# Patient Record
Sex: Female | Born: 1975 | Race: White | Hispanic: No | Marital: Married | Smoking: Never smoker
Health system: Southern US, Community
[De-identification: ages and names within clinical notes are randomized; demographics above are authoritative.]

---

## 1997-09-03 ENCOUNTER — Other Ambulatory Visit: Admission: RE | Admit: 1997-09-03 | Discharge: 1997-09-03 | Payer: Self-pay | Admitting: Obstetrics and Gynecology

## 2001-10-05 ENCOUNTER — Other Ambulatory Visit: Admission: RE | Admit: 2001-10-05 | Discharge: 2001-10-05 | Payer: Self-pay | Admitting: Obstetrics and Gynecology

## 2003-10-16 ENCOUNTER — Other Ambulatory Visit: Admission: RE | Admit: 2003-10-16 | Discharge: 2003-10-16 | Payer: Self-pay | Admitting: Obstetrics & Gynecology

## 2004-04-08 ENCOUNTER — Ambulatory Visit (HOSPITAL_COMMUNITY): Admission: RE | Admit: 2004-04-08 | Discharge: 2004-04-08 | Payer: Self-pay | Admitting: *Deleted

## 2004-04-08 ENCOUNTER — Ambulatory Visit (HOSPITAL_BASED_OUTPATIENT_CLINIC_OR_DEPARTMENT_OTHER): Admission: RE | Admit: 2004-04-08 | Discharge: 2004-04-08 | Payer: Self-pay | Admitting: *Deleted

## 2004-10-26 ENCOUNTER — Other Ambulatory Visit: Admission: RE | Admit: 2004-10-26 | Discharge: 2004-10-26 | Payer: Self-pay | Admitting: Obstetrics and Gynecology

## 2004-10-27 ENCOUNTER — Other Ambulatory Visit: Admission: RE | Admit: 2004-10-27 | Discharge: 2004-10-27 | Payer: Self-pay | Admitting: Obstetrics and Gynecology

## 2010-08-31 ENCOUNTER — Other Ambulatory Visit: Payer: Self-pay | Admitting: Obstetrics and Gynecology

## 2010-10-06 ENCOUNTER — Other Ambulatory Visit: Payer: Self-pay | Admitting: Obstetrics and Gynecology

## 2013-03-05 ENCOUNTER — Ambulatory Visit (INDEPENDENT_AMBULATORY_CARE_PROVIDER_SITE_OTHER): Payer: BC Managed Care – PPO | Admitting: Podiatry

## 2013-03-05 ENCOUNTER — Encounter: Payer: Self-pay | Admitting: Podiatry

## 2013-03-05 ENCOUNTER — Ambulatory Visit (INDEPENDENT_AMBULATORY_CARE_PROVIDER_SITE_OTHER): Payer: BC Managed Care – PPO

## 2013-03-05 VITALS — BP 97/58 | HR 67 | Resp 16 | Ht 63.0 in | Wt 112.0 lb

## 2013-03-05 DIAGNOSIS — M8430XA Stress fracture, unspecified site, initial encounter for fracture: Secondary | ICD-10-CM

## 2013-03-05 DIAGNOSIS — S92919A Unspecified fracture of unspecified toe(s), initial encounter for closed fracture: Secondary | ICD-10-CM

## 2013-03-05 NOTE — Progress Notes (Signed)
   Subjective:    Patient ID: Anna Parks, female    DOB: 17-Sep-1975, 38 y.o.   MRN: [id number]  HPI Comments: Bruising of my foot for no reason , it started last Saturday wore heels and when i took them off i noticed that my foot started aching real bad and by Saturday night the foot started bruising and swelled and tingly right foot , top and bottom of foot is bruised      Review of Systems  All other systems reviewed and are negative.       Objective:   Physical Exam I have reviewed her past medical history medications allergies surgeries social history and review of systems. Vital signs are stable she is alert and oriented x3. Pulses are strongly palpable bilateral. Capillary fill time to digits one through 5 of the bilateral foot is noted to be immediate. Deep tendon reflexes are intact bilateral. Neurologic sensorium is intact per since once the monofilament. Muscle strength is 5 over 5 dorsiflexors plantar flexors inverters everters all intrinsic musculature is intact. Orthopedic evaluation demonstrates all joints distal to the ankle have a full range of motion without crepitation. She does have tenderness on range of motion and palpation of the base of the fourth proximal phalanx and the metatarsophalangeal joint right foot. Radiographic evaluation does demonstrate a very fine nondisplaced non-comminuted fracture of the medial condyle of the base of the proximal phalanx fourth digit right foot. Cutaneous evaluation does demonstrates supple well hydrated cutis with mild erythema and ecchymosis overlying the dorsal and dorsal lateral aspect of the forefoot and an area of ecchymosis just beneath the fourth metatarsal plantarly. Radiographs also demonstrate a very early and very mild osteopenia to the distal foot.        Assessment & Plan:  Assessment: Fracture base of the fourth proximal phalanx right foot.  Plan: Discussed etiology pathology conservative versus surgical therapies.  I suggested that she wear hard soled shoes for the next few weeks and cut back on high impact activities.

## 2017-05-17 DIAGNOSIS — J019 Acute sinusitis, unspecified: Secondary | ICD-10-CM | POA: Diagnosis not present

## 2017-07-01 DIAGNOSIS — N390 Urinary tract infection, site not specified: Secondary | ICD-10-CM | POA: Diagnosis not present

## 2017-09-19 DIAGNOSIS — Z01419 Encounter for gynecological examination (general) (routine) without abnormal findings: Secondary | ICD-10-CM | POA: Diagnosis not present

## 2017-09-19 DIAGNOSIS — Z681 Body mass index (BMI) 19 or less, adult: Secondary | ICD-10-CM | POA: Diagnosis not present

## 2017-09-19 DIAGNOSIS — Z30432 Encounter for removal of intrauterine contraceptive device: Secondary | ICD-10-CM | POA: Diagnosis not present

## 2017-09-20 DIAGNOSIS — Z1231 Encounter for screening mammogram for malignant neoplasm of breast: Secondary | ICD-10-CM | POA: Diagnosis not present

## 2017-09-21 ENCOUNTER — Other Ambulatory Visit: Payer: Self-pay | Admitting: Obstetrics and Gynecology

## 2017-09-21 DIAGNOSIS — R928 Other abnormal and inconclusive findings on diagnostic imaging of breast: Secondary | ICD-10-CM

## 2017-09-22 ENCOUNTER — Ambulatory Visit
Admission: RE | Admit: 2017-09-22 | Discharge: 2017-09-22 | Disposition: A | Discharge status: Home / Self Care | Payer: 59 | Source: Ambulatory Visit | Attending: Obstetrics and Gynecology | Admitting: Obstetrics and Gynecology

## 2017-09-22 DIAGNOSIS — R928 Other abnormal and inconclusive findings on diagnostic imaging of breast: Secondary | ICD-10-CM

## 2017-09-22 DIAGNOSIS — R922 Inconclusive mammogram: Secondary | ICD-10-CM | POA: Diagnosis not present

## 2017-09-22 DIAGNOSIS — N6311 Unspecified lump in the right breast, upper outer quadrant: Secondary | ICD-10-CM | POA: Diagnosis not present

## 2017-09-26 DIAGNOSIS — Z30430 Encounter for insertion of intrauterine contraceptive device: Secondary | ICD-10-CM | POA: Diagnosis not present

## 2017-11-01 DIAGNOSIS — J029 Acute pharyngitis, unspecified: Secondary | ICD-10-CM | POA: Diagnosis not present

## 2017-11-01 DIAGNOSIS — J069 Acute upper respiratory infection, unspecified: Secondary | ICD-10-CM | POA: Diagnosis not present

## 2017-11-04 DIAGNOSIS — J019 Acute sinusitis, unspecified: Secondary | ICD-10-CM | POA: Diagnosis not present

## 2017-11-04 DIAGNOSIS — R05 Cough: Secondary | ICD-10-CM | POA: Diagnosis not present

## 2017-11-07 DIAGNOSIS — Z30431 Encounter for routine checking of intrauterine contraceptive device: Secondary | ICD-10-CM | POA: Diagnosis not present

## 2017-11-22 DIAGNOSIS — K219 Gastro-esophageal reflux disease without esophagitis: Secondary | ICD-10-CM | POA: Diagnosis not present

## 2017-11-22 DIAGNOSIS — J45998 Other asthma: Secondary | ICD-10-CM | POA: Diagnosis not present

## 2017-12-15 DIAGNOSIS — J029 Acute pharyngitis, unspecified: Secondary | ICD-10-CM | POA: Diagnosis not present

## 2017-12-20 DIAGNOSIS — J45998 Other asthma: Secondary | ICD-10-CM | POA: Diagnosis not present

## 2017-12-20 DIAGNOSIS — K219 Gastro-esophageal reflux disease without esophagitis: Secondary | ICD-10-CM | POA: Diagnosis not present

## 2017-12-20 DIAGNOSIS — T7849XA Other allergy, initial encounter: Secondary | ICD-10-CM | POA: Diagnosis not present

## 2017-12-21 DIAGNOSIS — T7849XA Other allergy, initial encounter: Secondary | ICD-10-CM | POA: Diagnosis not present

## 2017-12-27 DIAGNOSIS — Z0000 Encounter for general adult medical examination without abnormal findings: Secondary | ICD-10-CM | POA: Diagnosis not present

## 2018-01-02 DIAGNOSIS — R739 Hyperglycemia, unspecified: Secondary | ICD-10-CM | POA: Diagnosis not present

## 2018-01-02 DIAGNOSIS — Z0000 Encounter for general adult medical examination without abnormal findings: Secondary | ICD-10-CM | POA: Diagnosis not present

## 2018-01-02 DIAGNOSIS — Z6821 Body mass index (BMI) 21.0-21.9, adult: Secondary | ICD-10-CM | POA: Diagnosis not present

## 2018-03-01 ENCOUNTER — Encounter: Payer: Self-pay | Admitting: Podiatry

## 2018-04-04 DIAGNOSIS — R739 Hyperglycemia, unspecified: Secondary | ICD-10-CM | POA: Diagnosis not present

## 2018-04-06 DIAGNOSIS — Z6821 Body mass index (BMI) 21.0-21.9, adult: Secondary | ICD-10-CM | POA: Diagnosis not present

## 2018-04-06 DIAGNOSIS — R739 Hyperglycemia, unspecified: Secondary | ICD-10-CM | POA: Diagnosis not present

## 2018-04-09 DIAGNOSIS — N3000 Acute cystitis without hematuria: Secondary | ICD-10-CM | POA: Diagnosis not present

## 2018-05-14 DIAGNOSIS — N390 Urinary tract infection, site not specified: Secondary | ICD-10-CM | POA: Diagnosis not present

## 2019-07-24 ENCOUNTER — Other Ambulatory Visit: Payer: Self-pay | Admitting: Radiology

## 2019-07-24 DIAGNOSIS — N6310 Unspecified lump in the right breast, unspecified quadrant: Secondary | ICD-10-CM

## 2019-08-13 ENCOUNTER — Ambulatory Visit
Admission: RE | Admit: 2019-08-13 | Discharge: 2019-08-13 | Disposition: A | Discharge status: Home / Self Care | Payer: 59 | Source: Ambulatory Visit | Attending: Radiology | Admitting: Radiology

## 2019-08-13 ENCOUNTER — Other Ambulatory Visit: Payer: Self-pay

## 2019-08-13 DIAGNOSIS — N6310 Unspecified lump in the right breast, unspecified quadrant: Secondary | ICD-10-CM

## 2019-09-06 IMAGING — MG DIGITAL DIAGNOSTIC UNILATERAL RIGHT MAMMOGRAM WITH TOMO AND CAD
4 series · 4 of 12 positions shown · non-contrast
Comparison: September 20, 2017

CLINICAL DATA: 42-year-old patient recalled recent screening
mammogram for evaluation.

EXAM:
DIGITAL DIAGNOSTIC RIGHT MAMMOGRAM WITH TOMO
ULTRASOUND RIGHT BREAST

[R CC synth-2D]
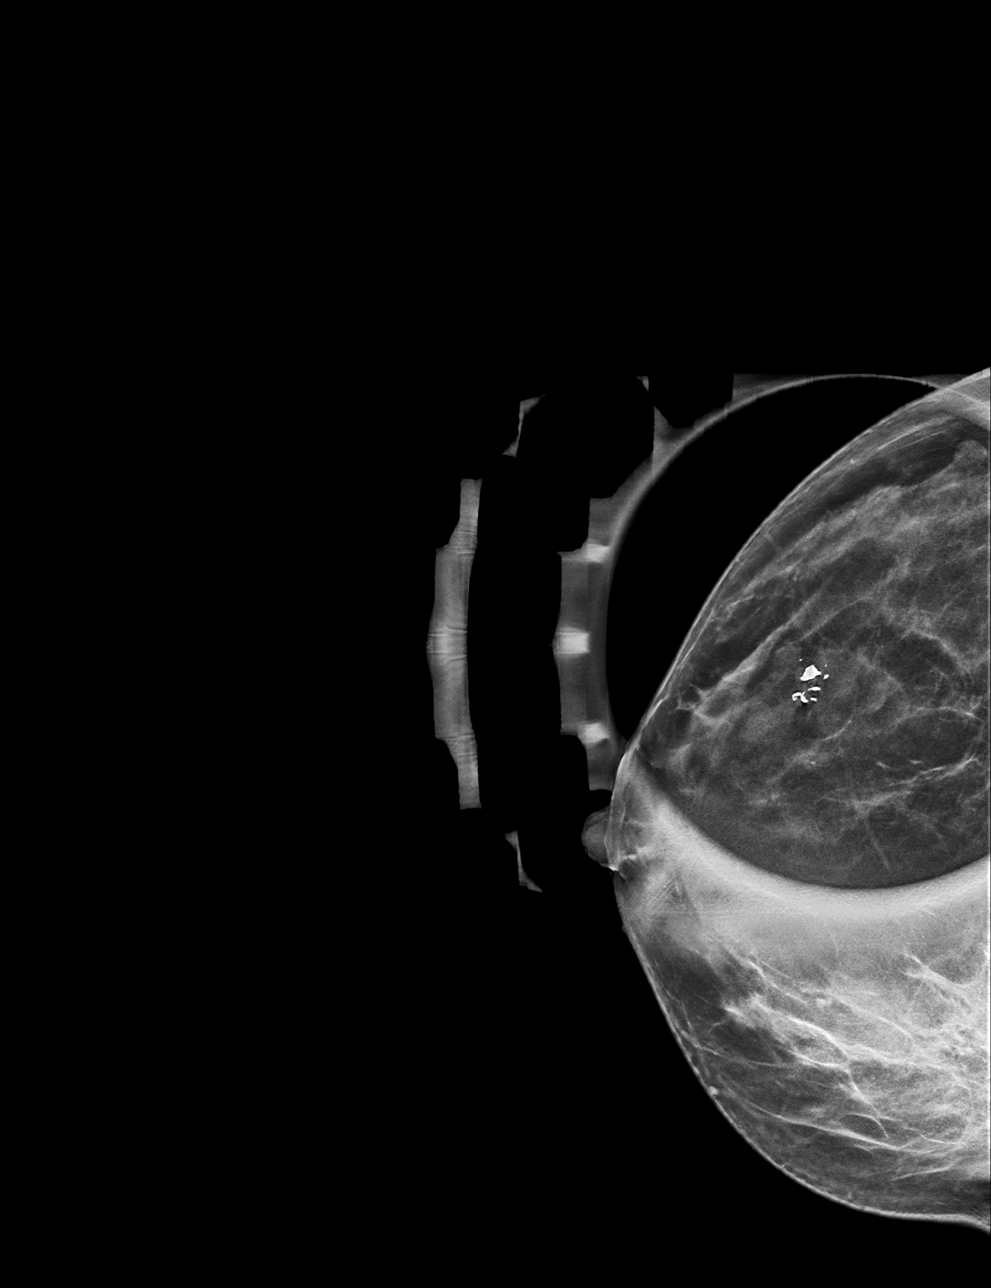

[R ML synth-2D]
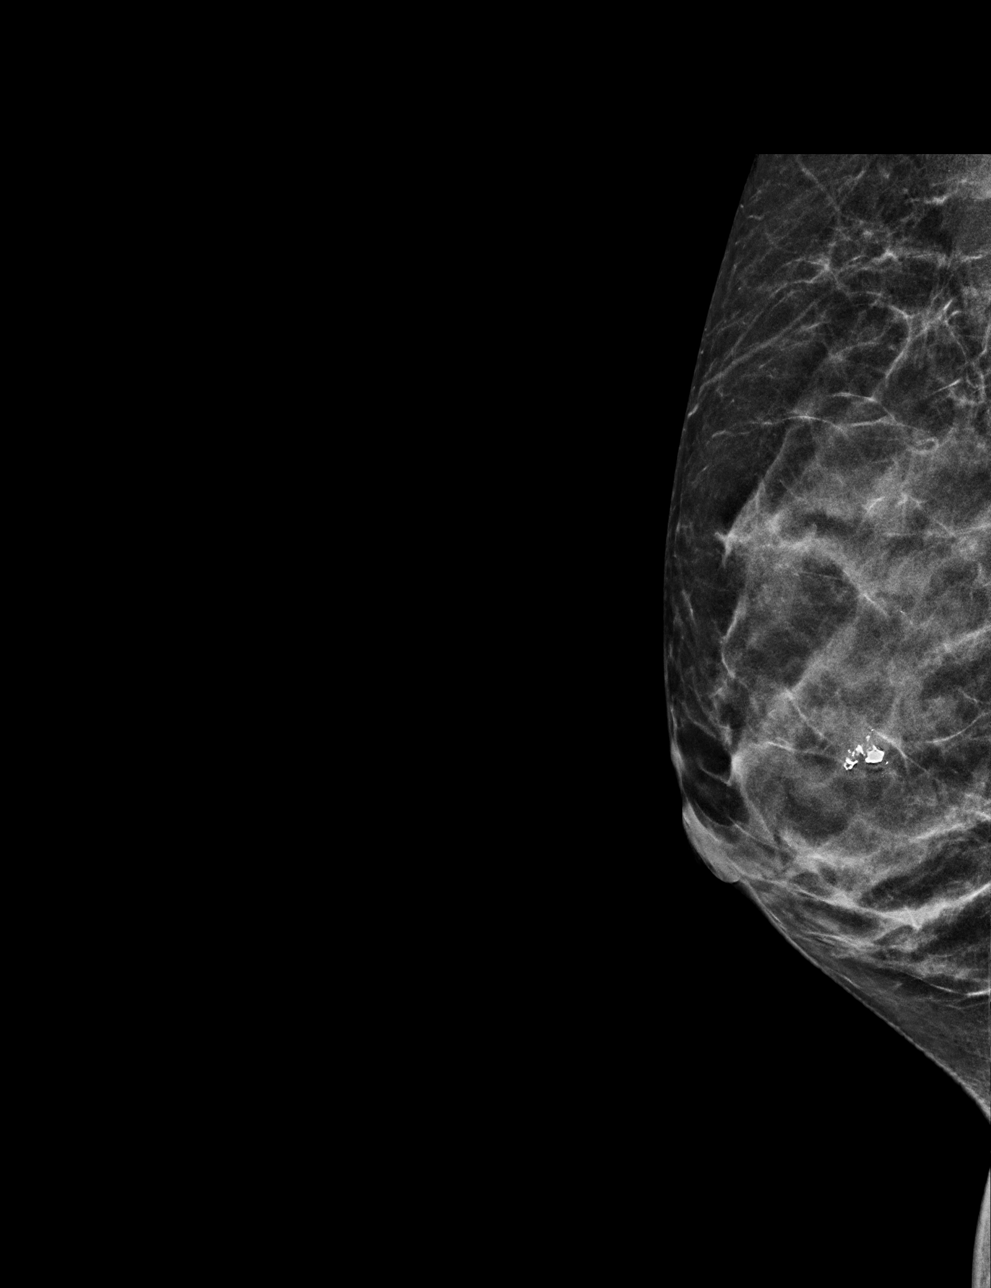

[R CC tomo · tomo slice 23/44.0]
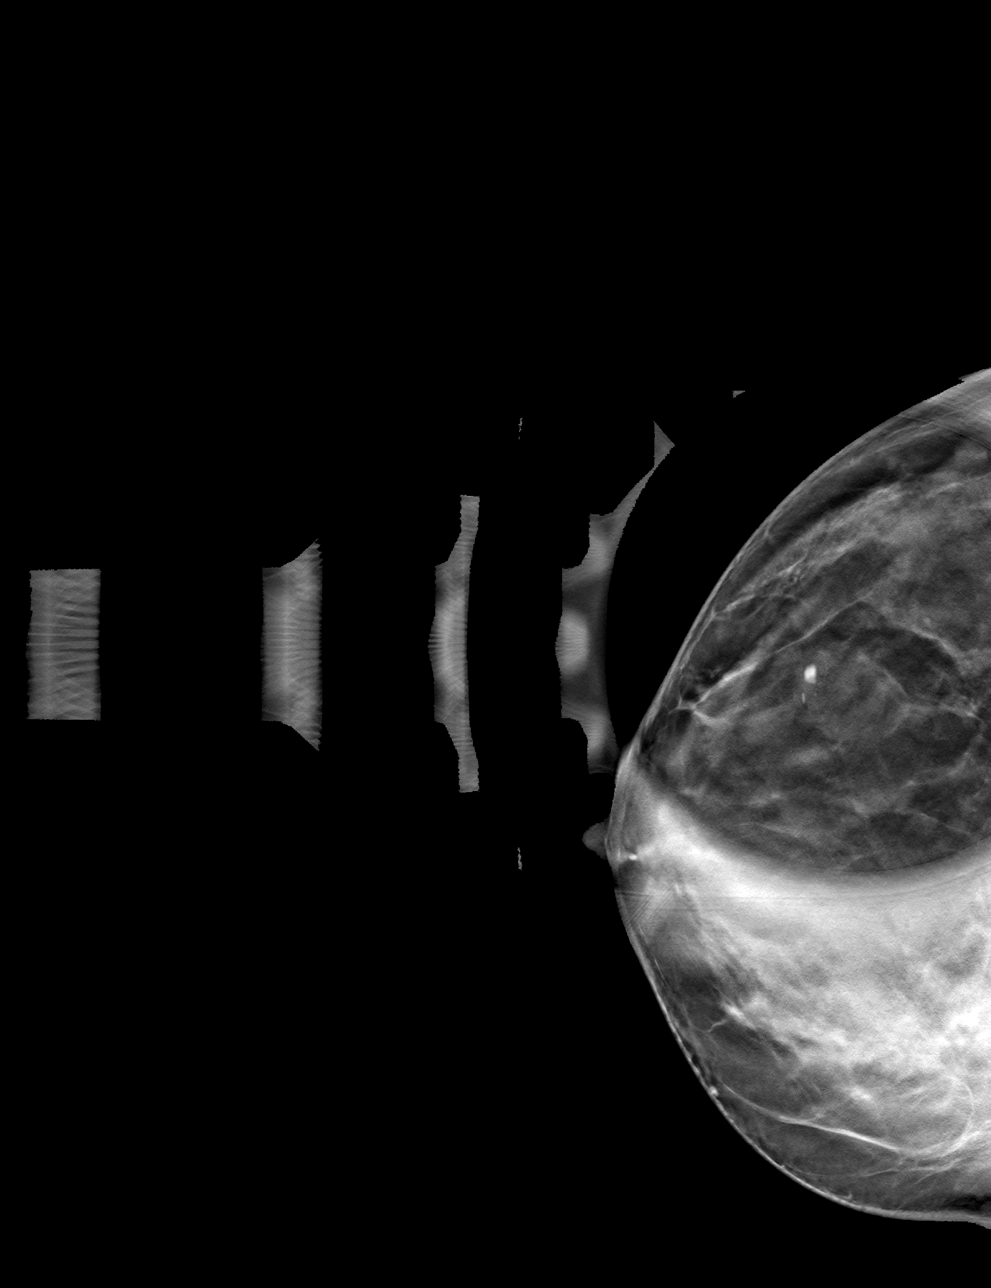

[R ML tomo · tomo slice 23/45.0]
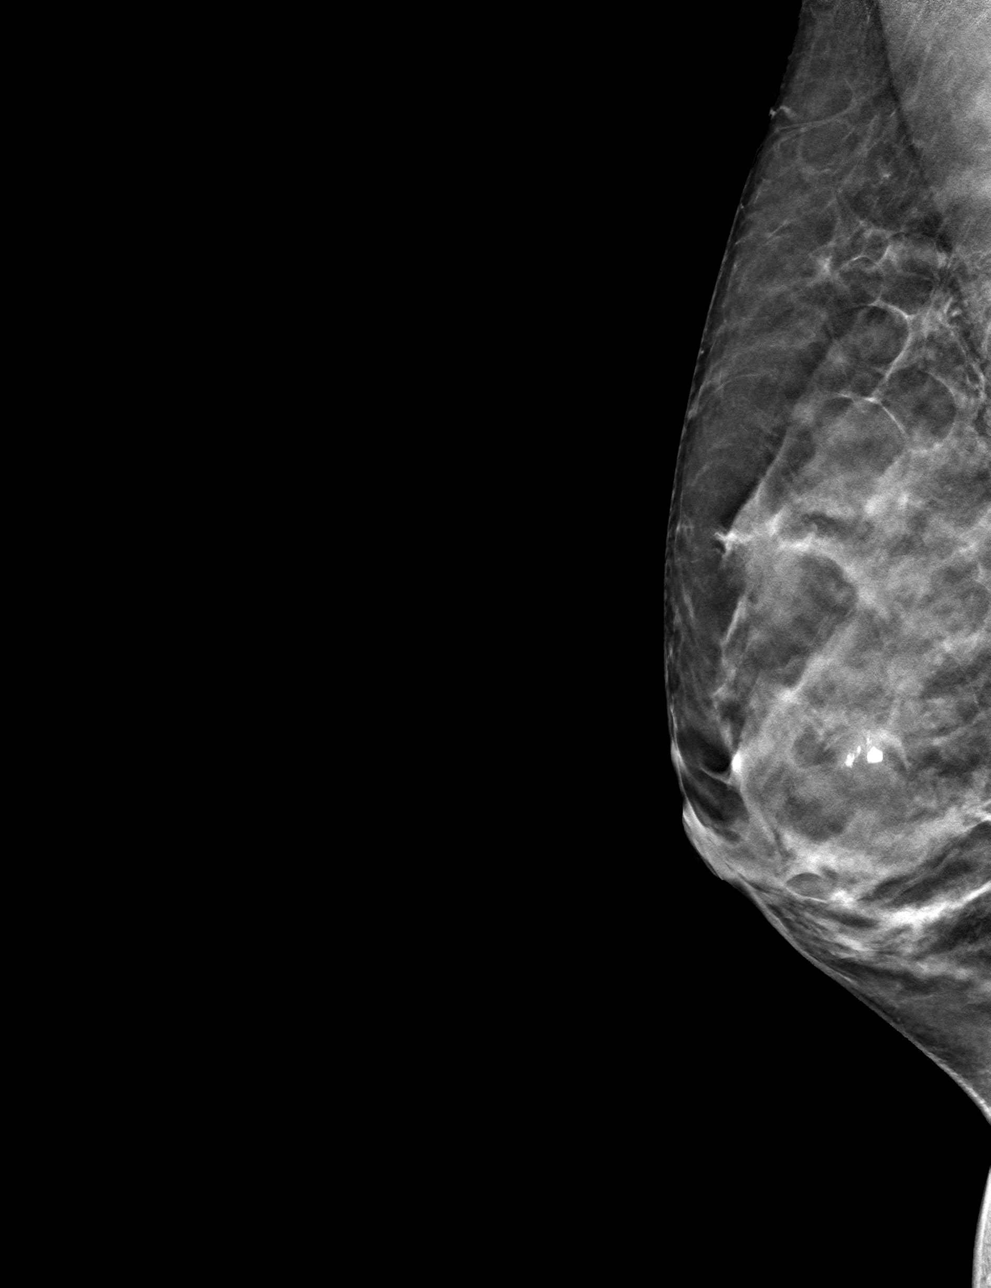

[4 of 12 positions shown; findings below may reference images not displayed]

ACR Breast Density Category c: The breast tissue is heterogeneously
dense, which may obscure small masses.
FINDINGS: Spot compression view of the outer right breast with tomography
confirms a circumscribed 0.5 cm mass. This mass is not visualized on
the 90 degree view with tomography performed today.

On physical exam, no mass is palpated in outer right breast.

Targeted ultrasound is performed, showing a circumscribed oval mass
at [DATE] position 6 cm from the nipple with a hypoechoic rim and a
relatively more hyperechoic center consistent with a benign
intramammary lymph node. This measures 0.5 x 0.4 x 0.5 cm and
corresponds in size, shape, and expected position to the
mammographically detected mass. Additional ultrasound of the outer
right breast demonstrates another circumscribed oval mass at 10
o'clock position 5 cm from the nipple measuring 0.6 x 0.3 x 0.7 cm.
There is posterior acoustic enhancement. This is favored to be a
benign fibroadenoma or less likely a complicated cyst. No associated
vascular flow.
IMPRESSION: 1. 0.5 cm benign intramammary lymph node [DATE] position right breast.
This is thought to account for the mass seen on the recent screening
mammogram.
2. 0.6 cm hypoechoic mass 10 o'clock position right breast 5 cm from
the nipple is probably benign, likely a benign fibroadenoma or
complicated cyst and six-month follow-up is recommended.

RECOMMENDATION:
Right breast ultrasound is recommended in 6 months to evaluate the
probably benign mass at 10 o'clock position.

I have discussed the findings and recommendations with the patient.
Results were also provided in writing at the conclusion of the
visit. If applicable, a reminder letter will be sent to the patient
regarding the next appointment.

BI-RADS CATEGORY  3: Probably benign.
